# Patient Record
Sex: Male | Born: 1985 | Race: Black or African American | Hispanic: No | Marital: Single | Smoking: Never smoker
Health system: Southern US, Community
[De-identification: ages and names within clinical notes are randomized; demographics above are authoritative.]

---

## 2007-12-19 ENCOUNTER — Emergency Department: Payer: Self-pay | Admitting: Emergency Medicine

## 2011-05-22 ENCOUNTER — Emergency Department: Payer: Self-pay | Admitting: Emergency Medicine

## 2011-05-24 ENCOUNTER — Emergency Department: Payer: Self-pay | Admitting: Emergency Medicine
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
## 2022-02-26 ENCOUNTER — Ambulatory Visit: Payer: Self-pay

## 2022-04-29 ENCOUNTER — Ambulatory Visit: Payer: Self-pay | Admitting: Nurse Practitioner

## 2022-04-29 ENCOUNTER — Encounter: Payer: Self-pay | Admitting: Nurse Practitioner

## 2022-04-29 DIAGNOSIS — Z113 Encounter for screening for infections with a predominantly sexual mode of transmission: Secondary | ICD-10-CM

## 2022-04-29 NOTE — Progress Notes (Signed)
[facility] Department STI clinic/screening visit  Subjective:  Arthur Graham is a 36 y.o. male being seen today for an STI screening visit. The patient reports they {Actions; do/do not:19616} have symptoms.    Patient has the following medical conditions:  There are no problems to display for this patient.    Chief Complaint  Patient presents with   SEXUALLY TRANSMITTED DISEASE    Screening     HPI  Patient reports ***  Does the patient or their partner desires a pregnancy in the next year? {yes/no:20286}  Screening for MPX risk: Does the patient have an unexplained rash? {yes/no:20286} Is the patient MSM? {yes/no:20286} Does the patient endorse multiple sex partners or anonymous sex partners? {yes/no:20286} Did the patient have close or sexual contact with a person diagnosed with MPX? {yes/no:20286} Has the patient traveled outside the [place] where MPX is endemic? {yes/no:20286} Is there a high clinical suspicion for MPX-- evidenced by one of the following {yes/no:20286}  -Unlikely to be chickenpox  -Lymphadenopathy  -Rash that present in same phase of evolution on any given body part   See flowsheet for further details and programmatic requirements.    There is no immunization history on file for this patient.   The following portions of the patient's history were reviewed and updated as appropriate: allergies, current medications, past medical history, past social history, past surgical history and problem list.  Objective:  There were no vitals filed for this visit.  Physical Exam Constitutional:      Appearance: Normal appearance.  HENT:     Head: Normocephalic. No abrasion, masses or laceration. Hair is normal.     Mouth/Throat:     Mouth: No oral lesions.     Pharynx: No pharyngeal swelling, oropharyngeal exudate, posterior oropharyngeal erythema or uvula swelling.     Tonsils: No tonsillar exudate or tonsillar abscesses.     Comments: Poor  dentiton Eyes:     General: Lids are normal.        Right eye: No discharge.        Left eye: No discharge.     Conjunctiva/sclera: Conjunctivae normal.     Right eye: No exudate.    Left eye: No exudate. Abdominal:     General: Abdomen is flat.     Palpations: Abdomen is soft.     Tenderness: There is no abdominal tenderness. There is no rebound.  Genitourinary:    Pubic Area: No rash or pubic lice.      Penis: Normal and circumcised. No erythema or discharge.      Testes: Normal.        Right: Mass or tenderness not present.        Left: Mass or tenderness not present.     Rectum: Normal.  Lymphadenopathy:     Cervical: No cervical adenopathy.     Right cervical: No superficial, deep or posterior cervical adenopathy.    Left cervical: No superficial, deep or posterior cervical adenopathy.     Upper Body:     Right upper body: No supraclavicular, axillary or epitrochlear adenopathy.     Left upper body: No supraclavicular, axillary or epitrochlear adenopathy.     Lower Body: No right inguinal adenopathy. No left inguinal adenopathy.  Skin:    Findings: No lesion or rash.       Assessment and Plan:  Arthur Graham is a 36 y.o. male presenting to the [facility] Department for STI screening  1. Screening examination  for venereal disease *** - Gonococcus culture - Chlamydia/GC NAA, Confirmation     Return if symptoms worsen or fail to improve.  No future appointments.  Glenna Fellows, FNP

## 2022-05-02 LAB — CHLAMYDIA/GC NAA, CONFIRMATION
Chlamydia trachomatis, NAA: NEGATIVE
Neisseria gonorrhoeae, NAA: NEGATIVE

## 2022-05-03 LAB — GONOCOCCUS CULTURE

## 2022-05-03 LAB — SPECIMEN STATUS REPORT

## 2022-11-05 ENCOUNTER — Encounter: Payer: Self-pay | Admitting: Family Medicine

## 2022-11-05 ENCOUNTER — Ambulatory Visit: Payer: Self-pay | Admitting: Family Medicine

## 2022-11-05 DIAGNOSIS — Z113 Encounter for screening for infections with a predominantly sexual mode of transmission: Secondary | ICD-10-CM

## 2022-11-05 DIAGNOSIS — N341 Nonspecific urethritis: Secondary | ICD-10-CM

## 2022-11-05 LAB — HM HIV SCREENING LAB: HM HIV Screening: NEGATIVE

## 2022-11-05 LAB — HM HEPATITIS C SCREENING LAB: HM Hepatitis Screen: NEGATIVE

## 2022-11-05 LAB — HEPATITIS B SURFACE ANTIGEN: Hepatitis B Surface Ag: NONREACTIVE

## 2022-11-05 LAB — GRAM STAIN

## 2022-11-05 MED ORDER — DOXYCYCLINE HYCLATE 100 MG PO TABS: 100.0000 mg | ORAL_TABLET | Freq: Two times a day (BID) | ORAL | 0 refills | Status: AC

## 2022-11-05 NOTE — Progress Notes (Addendum)
Pt here for STI screening.  Gram stain results reviewed with patient.  Results positive for NGU.  Doxycycline 100mg  #14 1 po BID x 7 days dispensed to patient.  Pt counseled re medication, side effects, plan of care and when to contact clinic with questions or concerns.  Pt verbalizes understanding of above.  NGU pamphlet provided, contact cards x 2 given.  Condoms declined.  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

## 2022-11-05 NOTE — Progress Notes (Signed)
[facility] Department STI clinic/screening visit  Subjective:  Arthur Graham is a 37 y.o. male being seen today for an STI screening visit. The patient reports they do not have symptoms.    Patient has the following medical conditions:  There are no problems to display for this patient.    Chief Complaint  Patient presents with   SEXUALLY TRANSMITTED DISEASE    Screening- patient states he is not having amy symptoms     HPI  Patient reports to clinic for STI testing, no symptoms.   Last HIV test per patient/review of record was No results found for: "HMHIVSCREEN" No results found for: "HIV"  Does the patient or their partner desires a pregnancy in the next year? No  Screening for MPX risk: Does the patient have an unexplained rash? No Is the patient MSM? No Does the patient endorse multiple sex partners or anonymous sex partners? No Did the patient have close or sexual contact with a person diagnosed with MPX? No Has the patient traveled outside the [place] where MPX is endemic? No Is there a high clinical suspicion for MPX-- evidenced by one of the following No  -Unlikely to be chickenpox  -Lymphadenopathy  -Rash that present in same phase of evolution on any given body part   See flowsheet for further details and programmatic requirements.    There is no immunization history on file for this patient.   The following portions of the patient's history were reviewed and updated as appropriate: allergies, current medications, past medical history, past social history, past surgical history and problem list.  Objective:  There were no vitals filed for this visit.  Physical Exam Constitutional:      Appearance: Normal appearance.  HENT:     Head: Normocephalic and atraumatic.     Comments: No nits or hair loss    Mouth/Throat:     Mouth: Mucous membranes are moist. No oral lesions.     Pharynx: Oropharynx is clear. No oropharyngeal exudate or posterior  oropharyngeal erythema.  Eyes:     General:        Right eye: No discharge.        Left eye: No discharge.     Conjunctiva/sclera:     Right eye: Right conjunctiva is not injected. No exudate.    Left eye: Left conjunctiva is not injected. No exudate. Pulmonary:     Effort: Pulmonary effort is normal.  Abdominal:     General: Abdomen is flat.     Palpations: Abdomen is soft. There is no hepatomegaly or mass.     Tenderness: There is no abdominal tenderness. There is no rebound.     Hernia: There is no hernia in the left inguinal area or right inguinal area.  Genitourinary:    Pubic Area: No rash or pubic lice (no nits).      Penis: Normal. No tenderness, discharge, swelling or lesions.      Testes: Normal.     Epididymis:     Right: Normal. No mass or tenderness.     Left: Normal. No mass or tenderness.     Rectum: Normal. No tenderness (no lesions or discharge).     Comments: Penile Discharge Amount: none Color:  none Lymphadenopathy:     Head:     Right side of head: No preauricular or posterior auricular adenopathy.     Left side of head: No preauricular or posterior auricular adenopathy.     Cervical: No cervical adenopathy.  Upper Body:     Right upper body: No supraclavicular, axillary or epitrochlear adenopathy.     Left upper body: No supraclavicular, axillary or epitrochlear adenopathy.     Lower Body: No right inguinal adenopathy. No left inguinal adenopathy.  Skin:    General: Skin is warm and dry.     Findings: No lesion or rash.  Neurological:     Mental Status: He is alert and oriented to person, place, and time.       Assessment and Plan:  Arthur Graham is a 37 y.o. male presenting to the [facility] for STI screening  1. Screening for venereal disease  - Chlamydia/GC NAA, Confirmation - Syphilis Serology, [facility] - Gonococcus culture - HBV Antigen/Antibody [facility] - HIV/HCV [facility] - Gram  stain   Patient does not have STI symptoms Patient accepted all screenings including   Patient meets criteria for HepB screening? Yes. Ordered? yes Patient meets criteria for HepC screening? Yes. Ordered? yes Recommended condom use with all sex Discussed importance of condom use for STI prevent  Treat gram stain per standing order Discussed time line for [facility] results and that patient will be called with positive results and encouraged patient to call if he had not heard in 2 weeks Recommended repeat testing in 3 months with positive results. Recommended returning for continued or worsening symptoms.   No follow-ups on file.  Total time spent 20 minutes  Sharlet Salina, [place]

## 2022-11-05 NOTE — Addendum Note (Signed)
Addended by: Forest Becker on: 11/05/2022 03:51 PM   Modules accepted: Orders

## 2022-11-10 LAB — GONOCOCCUS CULTURE

## 2022-11-10 LAB — CHLAMYDIA/GC NAA, CONFIRMATION
Chlamydia trachomatis, NAA: NEGATIVE
Neisseria gonorrhoeae, NAA: NEGATIVE

## 2023-10-14 ENCOUNTER — Ambulatory Visit: Payer: Self-pay
